# Patient Record
Sex: Male | Born: 1985 | Hispanic: Yes | Marital: Married | Smoking: Never smoker
Health system: Southern US, Community
[De-identification: ages and names within clinical notes are randomized; demographics above are authoritative.]

---

## 2007-06-08 ENCOUNTER — Emergency Department: Payer: Self-pay | Admitting: Emergency Medicine

## 2008-04-19 ENCOUNTER — Emergency Department: Payer: Self-pay | Admitting: Emergency Medicine

## 2010-07-04 ENCOUNTER — Emergency Department: Payer: Self-pay | Admitting: Emergency Medicine

## 2013-09-08 ENCOUNTER — Emergency Department: Payer: Self-pay | Admitting: Emergency Medicine

## 2018-04-07 ENCOUNTER — Emergency Department
Admission: EM | Admit: 2018-04-07 | Discharge: 2018-04-07 | Disposition: A | Discharge status: Home / Self Care | Payer: No Typology Code available for payment source | Attending: Emergency Medicine | Admitting: Emergency Medicine

## 2018-04-07 ENCOUNTER — Emergency Department: Payer: No Typology Code available for payment source

## 2018-04-07 ENCOUNTER — Other Ambulatory Visit: Payer: Self-pay

## 2018-04-07 ENCOUNTER — Encounter: Payer: Self-pay | Admitting: Emergency Medicine

## 2018-04-07 DIAGNOSIS — M7918 Myalgia, other site: Secondary | ICD-10-CM

## 2018-04-07 DIAGNOSIS — M791 Myalgia, unspecified site: Secondary | ICD-10-CM | POA: Diagnosis not present

## 2018-04-07 DIAGNOSIS — M542 Cervicalgia: Secondary | ICD-10-CM | POA: Diagnosis present

## 2018-04-07 MED ORDER — IBUPROFEN 600 MG PO TABS: 600.0000 mg | ORAL_TABLET | Freq: Three times a day (TID) | ORAL | 0 refills | Status: AC | PRN

## 2018-04-07 MED ORDER — CYCLOBENZAPRINE HCL 10 MG PO TABS
10.0000 mg | ORAL_TABLET | Freq: Once | ORAL | Status: AC
  Administered 2018-04-07: 10 mg via ORAL
  Filled 2018-04-07: qty 1

## 2018-04-07 MED ORDER — CYCLOBENZAPRINE HCL 10 MG PO TABS: 10.0000 mg | ORAL_TABLET | Freq: Three times a day (TID) | ORAL | 0 refills | Status: AC | PRN

## 2018-04-07 MED ORDER — TRAMADOL HCL 50 MG PO TABS: 50.0000 mg | ORAL_TABLET | Freq: Two times a day (BID) | ORAL | 0 refills | Status: AC | PRN

## 2018-04-07 MED ORDER — IBUPROFEN 600 MG PO TABS
600.0000 mg | ORAL_TABLET | Freq: Once | ORAL | Status: AC
  Administered 2018-04-07: 600 mg via ORAL
  Filled 2018-04-07: qty 1

## 2018-04-07 MED ORDER — TRAMADOL HCL 50 MG PO TABS
50.0000 mg | ORAL_TABLET | Freq: Once | ORAL | Status: AC
  Administered 2018-04-07: 50 mg via ORAL
  Filled 2018-04-07: qty 1
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
## 2018-05-20 DIAGNOSIS — R945 Abnormal results of liver function studies: Secondary | ICD-10-CM | POA: Insufficient documentation

## 2018-05-20 DIAGNOSIS — R109 Unspecified abdominal pain: Secondary | ICD-10-CM | POA: Insufficient documentation

## 2018-05-20 LAB — URINALYSIS, COMPLETE (UACMP) WITH MICROSCOPIC
BILIRUBIN URINE: NEGATIVE
Bacteria, UA: NONE SEEN
GLUCOSE, UA: NEGATIVE mg/dL
HGB URINE DIPSTICK: NEGATIVE
Ketones, ur: NEGATIVE mg/dL
Leukocytes, UA: NEGATIVE
NITRITE: NEGATIVE
PH: 6 (ref 5.0–8.0)
Protein, ur: NEGATIVE mg/dL
SPECIFIC GRAVITY, URINE: 1.017 (ref 1.005–1.030)

## 2018-05-20 LAB — CBC
HCT: 42.8 % (ref 40.0–52.0)
Hemoglobin: 14.6 g/dL (ref 13.0–18.0)
MCH: 29.9 pg (ref 26.0–34.0)
MCHC: 34.2 g/dL (ref 32.0–36.0)
MCV: 87.6 fL (ref 80.0–100.0)
PLATELETS: 211 10*3/uL (ref 150–440)
RBC: 4.88 MIL/uL (ref 4.40–5.90)
RDW: 12.3 % (ref 11.5–14.5)
WBC: 7.9 10*3/uL (ref 3.8–10.6)

## 2018-05-20 LAB — COMPREHENSIVE METABOLIC PANEL
ALBUMIN: 4.4 g/dL (ref 3.5–5.0)
ALK PHOS: 124 U/L (ref 38–126)
ALT: 67 U/L — ABNORMAL HIGH (ref 0–44)
AST: 48 U/L — AB (ref 15–41)
Anion gap: 6 (ref 5–15)
BILIRUBIN TOTAL: 0.8 mg/dL (ref 0.3–1.2)
BUN: 21 mg/dL — AB (ref 6–20)
CALCIUM: 9.3 mg/dL (ref 8.9–10.3)
CO2: 30 mmol/L (ref 22–32)
CREATININE: 0.83 mg/dL (ref 0.61–1.24)
Chloride: 105 mmol/L (ref 98–111)
GFR calc Af Amer: >60 mL/min (ref >60)
GFR calc non Af Amer: >60 mL/min (ref >60)
GLUCOSE: 119 mg/dL — AB (ref 70–99)
Potassium: 3.7 mmol/L (ref 3.5–5.1)
Sodium: 141 mmol/L (ref 135–145)
Total Protein: 7.8 g/dL (ref 6.5–8.1)

## 2018-05-20 LAB — LIPASE, BLOOD: Lipase: 31 U/L (ref 11–51)

## 2018-05-20 NOTE — ED Triage Notes (Signed)
Patient c/o medial/lower abdominal pain beginning today. Patient c/o pain post urination X 1 week. Patient reports malodorous urine.

## 2018-05-21 ENCOUNTER — Emergency Department
Admission: EM | Admit: 2018-05-21 | Discharge: 2018-05-21 | Disposition: A | Discharge status: Home / Self Care | Payer: Self-pay | Attending: Emergency Medicine | Admitting: Emergency Medicine

## 2018-05-21 ENCOUNTER — Emergency Department: Payer: Self-pay

## 2018-05-21 ENCOUNTER — Encounter: Payer: Self-pay | Admitting: Radiology

## 2018-05-21 DIAGNOSIS — R7989 Other specified abnormal findings of blood chemistry: Secondary | ICD-10-CM

## 2018-05-21 DIAGNOSIS — R109 Unspecified abdominal pain: Secondary | ICD-10-CM

## 2018-05-21 DIAGNOSIS — R945 Abnormal results of liver function studies: Secondary | ICD-10-CM

## 2018-05-21 MED ORDER — ONDANSETRON HCL 4 MG/2ML IJ SOLN
4.0000 mg | INTRAMUSCULAR | Status: AC
  Administered 2018-05-21: 4 mg via INTRAVENOUS
  Filled 2018-05-21: qty 2

## 2018-05-21 MED ORDER — IOPAMIDOL (ISOVUE-300) INJECTION 61%
100.0000 mL | Freq: Once | INTRAVENOUS | Status: AC | PRN
  Administered 2018-05-21: 100 mL via INTRAVENOUS

## 2018-05-21 MED ORDER — MORPHINE SULFATE (PF) 4 MG/ML IV SOLN
4.0000 mg | Freq: Once | INTRAVENOUS | Status: AC
  Administered 2018-05-21: 4 mg via INTRAVENOUS
  Filled 2018-05-21: qty 1

## 2018-05-21 MED ORDER — OXYCODONE-ACETAMINOPHEN 5-325 MG PO TABS
2.0000 | ORAL_TABLET | Freq: Once | ORAL | Status: AC
  Administered 2018-05-21: 2 via ORAL
  Filled 2018-05-21: qty 2

## 2018-05-21 NOTE — Discharge Instructions (Signed)

## 2018-05-21 NOTE — ED Provider Notes (Signed)
[facility] Emergency Department Provider Note  ____________________________________________   First MD Initiated Contact with Patient 05/21/18 [phone]     (approximate)  I have reviewed the triage vital signs and the nursing notes.   HISTORY  Chief Complaint Abdominal Pain  The patient and/or family speak(s) Spanish.  They understand they have the right to the use of a hospital interpreter, however at this time they prefer to speak directly with me in Spanish.  They know that they can ask for an interpreter at any time.   HPI Dylan Ponce is a 32 y.o. male with no chronic medical issues who presents for evaluation of persistent abdominal pain over the last 24 hours.  He states that occasionally he will have some pain in his lower abdomen after urinating but that urinating itself is not painful.  However the pain would come and go before but over the last 24 hours he says it has been persistent and it hurt all day while he was at work.  Nothing particular makes it better or worse.  He does not feel worse after eating and drinking which she has been able to do today.  He denies fever/chills, chest pain, shortness of breath, nausea, vomiting, diarrhea, and constipation.  He states he had 3 normal bowel movements today which is his usual routine.  He is not having any burning with urination.  He has no upper abdominal pain.  He describes the pain is severe, aching, and constant.  History reviewed. No pertinent past medical history.  There are no active problems to display for this patient.   History reviewed. No pertinent surgical history.  Prior to Admission medications   Medication Sig Start Date End Date Taking? Authorizing Provider  cyclobenzaprine (FLEXERIL) 10 MG tablet Take 1 tablet (10 mg total) by mouth 3 (three) times daily as needed. 04/07/18   Joni Reining, PA-C  ibuprofen (ADVIL,MOTRIN) 600 MG tablet Take 1 tablet (600 mg total) by mouth  every 8 (eight) hours as needed. 04/07/18   Joni Reining, PA-C  traMADol (ULTRAM) 50 MG tablet Take 1 tablet (50 mg total) by mouth every 12 (twelve) hours as needed. 04/07/18   Joni Reining, PA-C    Allergies Patient has no known allergies.  No family history on file.  Social History Social History   Tobacco Use  . Smoking status: Never Smoker  . Smokeless tobacco: Never Used  Substance Use Topics  . Alcohol use: Yes    Comment: occasional  . Drug use: Not on file    Review of Systems Constitutional: No fever/chills Eyes: No visual changes. ENT: No sore throat. Cardiovascular: Denies chest pain. Respiratory: Denies shortness of breath. Gastrointestinal: Abdominal pain as described above. Genitourinary: Negative for dysuria. Musculoskeletal: Negative for neck pain.  Negative for back pain. Integumentary: Negative for rash. Neurological: Negative for headaches, focal weakness or numbness.   ____________________________________________   PHYSICAL EXAM:  VITAL SIGNS: ED Triage Vitals  Enc Vitals Group     BP 05/20/18 2249 134/86     Pulse Rate 05/20/18 2249 64     Resp 05/20/18 2249 18     Temp 05/20/18 2249 98.2 F (36.8 C)     Temp Source 05/20/18 2249 Oral     SpO2 05/20/18 2249 94 %     Weight 05/20/18 2250 78.9 kg (174 lb)     Height 05/20/18 2250 1.702 m (5\' 7" )     Head Circumference --  Peak Flow --      Pain Score 05/20/18 2249 10     Pain Loc --      Pain Edu? --      Excl. in GC? --     Constitutional: Alert and oriented.  Generally well-appearing but does appear uncomfortable Eyes: Conjunctivae are normal.  Head: Atraumatic. Nose: No congestion/rhinnorhea. Mouth/Throat: Mucous membranes are moist. Neck: No stridor.  No meningeal signs.   Cardiovascular: Normal rate, regular rhythm. Good peripheral circulation. Grossly normal heart sounds. Respiratory: Normal respiratory effort.  No retractions. Lungs CTAB. Gastrointestinal: Soft with  generalized tenderness to palpation throughout the entire abdomen.  There is no focal tenderness of the epigastrium, no right upper quadrant tenderness, and no Murphy sign.  No rebound and no guarding. Musculoskeletal: No lower extremity tenderness nor edema. No gross deformities of extremities. Neurologic:  Normal speech and language. No gross focal neurologic deficits are appreciated.  Skin:  Skin is warm, dry and intact. No rash noted. Psychiatric: Mood and affect are normal. Speech and behavior are normal.  ____________________________________________   LABS (all labs ordered are listed, but only abnormal results are displayed)  Labs Reviewed  COMPREHENSIVE METABOLIC PANEL - Abnormal; Notable for the following components:      Result Value   Glucose, Bld 119 (*)    BUN 21 (*)    AST 48 (*)    ALT 67 (*)    All other components within normal limits  URINALYSIS, COMPLETE (UACMP) WITH MICROSCOPIC - Abnormal; Notable for the following components:   Color, Urine YELLOW (*)    APPearance CLEAR (*)    All other components within normal limits  LIPASE, BLOOD  CBC   ____________________________________________  EKG  None - EKG not ordered by ED physician ____________________________________________  RADIOLOGY   ED MD interpretation: No indication of any acute abnormalities on the CT scan of the abdomen and pelvis  Official radiology report(s): Ct Abdomen Pelvis W Contrast  Result Date: 05/21/2018 CLINICAL DATA:  Acute abdominal pain.  Nausea and vomiting. EXAM: CT ABDOMEN AND PELVIS WITH CONTRAST TECHNIQUE: Multidetector CT imaging of the abdomen and pelvis was performed using the standard protocol following bolus administration of intravenous contrast. CONTRAST:  ISOVUE-300 IOPAMIDOL (ISOVUE-300) INJECTION 61% COMPARISON:  None. FINDINGS: Lower chest: Scattered lower lobe, lingular and right middle lobe atelectasis. No pleural fluid or confluent consolidation.  Hepatobiliary: Focal fatty infiltration adjacent with falciform ligament. No discrete focal lesion. Gallbladder physiologically distended, no calcified stone. No biliary dilatation. Pancreas: No ductal dilatation or inflammation. Spleen: Normal in size without focal abnormality. Adrenals/Urinary Tract: Normal adrenal glands. No hydronephrosis or perinephric edema. Homogeneous renal enhancement. Urinary bladder is minimally distended, and not well evaluated. Stomach/Bowel: Stomach is within normal limits. Appendix appears normal. No evidence of bowel wall thickening, distention, or inflammatory changes. Vascular/Lymphatic: No significant vascular findings are present. No enlarged abdominal or pelvic lymph nodes. Reproductive: Prostate is unremarkable. Other: No free air, free fluid, or intra-abdominal fluid collection. Musculoskeletal: There are no acute or suspicious osseous abnormalities. IMPRESSION: No acute findings in the abdomen/pelvis or explanation for abdominal pain. Electronically Signed   By: Narda Rutherford M.D.   On: 05/21/2018 04:34    ____________________________________________   PROCEDURES  Critical Care performed: No   Procedure(s) performed:   Procedures   ____________________________________________   INITIAL IMPRESSION / ASSESSMENT AND PLAN / ED COURSE  As part of my medical decision making, I reviewed the following data within the electronic MEDICAL RECORD NUMBER History obtained  from family, Nursing notes reviewed and incorporated, Labs reviewed  and Notes from prior ED visits    Differential diagnosis includes, but is not limited to, appendicitis, diverticulitis, acid reflux, musculoskeletal strain, less likely biliary colic based on his physical exam.  Lab work is all within normal limits with no LFT elevation nor lipase elevation.  He says he does not drink alcohol.  Given the pain is describing I will obtain a CT scan of the abdomen and pelvis for evaluation of possible  acute infectious process.  when doing taken a light bulb there is I am giving morphine 4 mill grams IV and Zofran 4 mg IV for symptomatic relief.   (Note that documentation was delayed due to multiple ED patients requiring immediate care.)   CT scan was unremarkable with no abnormalities identified.  The patient is more comfortable and he also received 2 Percocet by mouth.  I explained the results to him and his wife and they are comfortable with plan for discharge and outpatient follow-up.   I gave my usual and customary return precautions.        ____________________________________________  FINAL CLINICAL IMPRESSION(S) / ED DIAGNOSES  Final diagnoses:  Abdominal pain, unspecified abdominal location  Elevated LFTs     MEDICATIONS GIVEN DURING THIS VISIT:  Medications  morphine 4 MG/ML injection 4 mg (4 mg Intravenous Given 05/21/18 0400)  ondansetron (ZOFRAN) injection 4 mg (4 mg Intravenous Given 05/21/18 0400)  iopamidol (ISOVUE-300) 61 % injection 100 mL (100 mLs Intravenous Contrast Given 05/21/18 0410)  oxyCODONE-acetaminophen (PERCOCET/ROXICET) 5-325 MG per tablet 2 tablet (2 tablets Oral Given 05/21/18 1610)     ED Discharge Orders    None       Note:  This document was prepared using Dragon voice recognition software and may include unintentional dictation errors.    Loleta Rose, MD 05/21/18 [phone]

## 2019-03-29 ENCOUNTER — Other Ambulatory Visit: Payer: Self-pay

## 2019-03-29 ENCOUNTER — Emergency Department
Admission: EM | Admit: 2019-03-29 | Discharge: 2019-03-29 | Disposition: A | Discharge status: Home / Self Care | Payer: Self-pay | Attending: Emergency Medicine | Admitting: Emergency Medicine

## 2019-03-29 ENCOUNTER — Encounter: Payer: Self-pay | Admitting: Emergency Medicine

## 2019-03-29 DIAGNOSIS — Z5321 Procedure and treatment not carried out due to patient leaving prior to being seen by health care provider: Secondary | ICD-10-CM | POA: Insufficient documentation

## 2019-03-29 DIAGNOSIS — R51 Headache: Secondary | ICD-10-CM | POA: Insufficient documentation

## 2019-03-29 NOTE — ED Triage Notes (Signed)
Patient ambulatory to triage with steady gait, without difficulty or distress noted, mask in place; pt reports frontal and temporal HA x 3wks accomp by nausea; st rx med last wk by doctor without relief but unsure name; st hx migraines several years ago

## 2019-03-29 NOTE — ED Notes (Signed)
Pt visitor to STAT desk and report pt is wanting to go home. Headache is improving. Pt educated regarding risks of leaving prior to MD evaluation. Verbalized understanding. Pt to return if symptoms worsen.

## 2019-08-23 IMAGING — CT CT ABD-PELV W/ CM
2 of 4 series · 16 of 46 positions shown, 18 images · IV contrast (APPLIED)
Comparison: None.

CLINICAL DATA: Acute abdominal pain.  Nausea and vomiting.

EXAM:
CT ABDOMEN AND PELVIS WITH CONTRAST
TECHNIQUE: Multidetector CT imaging of the abdomen and pelvis was performed
using the standard protocol following bolus administration of
intravenous contrast.
CONTRAST:  100mL [id number] IOPAMIDOL ([id number]) INJECTION 61%

[Series 2: routine abd/pel with · axial · 0.70mm/px · z∈[-890,-430]mm · 13 of 102 slices shown, 15 images]
[im 5/102  soft-tissue]
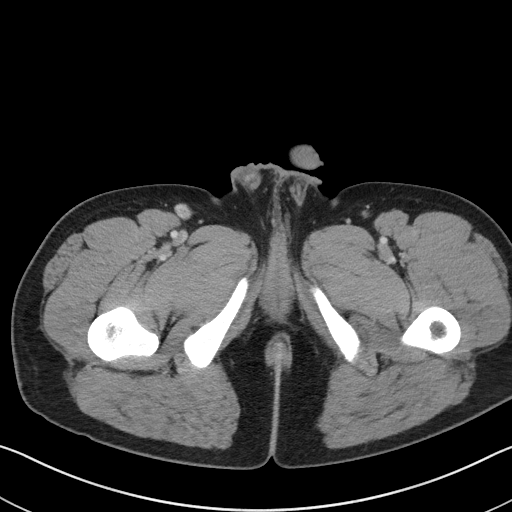
[im 5/102  bone]
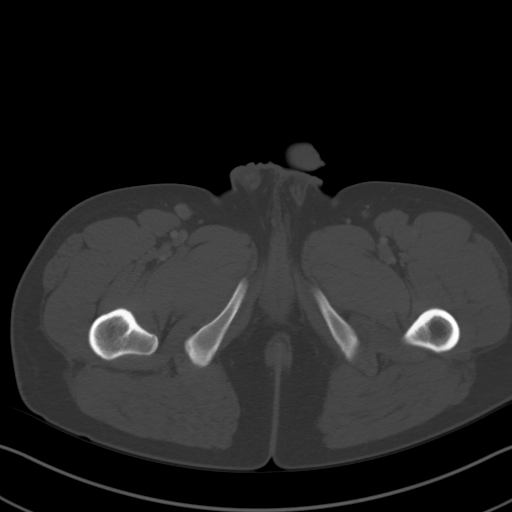
[im 13/102  soft-tissue]
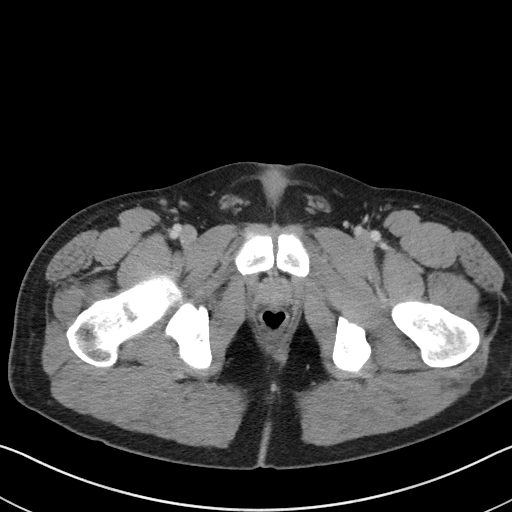
[im 21/102  soft-tissue]
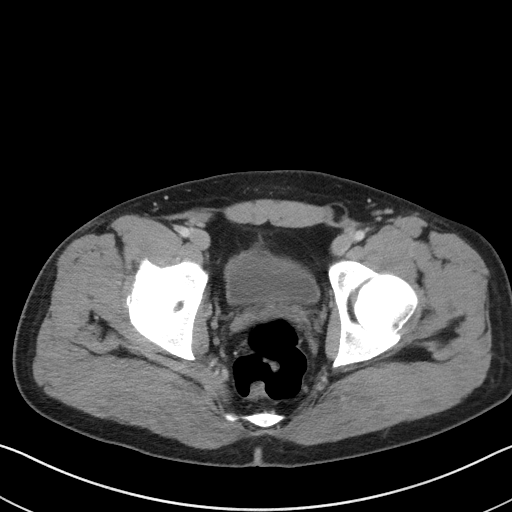
[im 29/102  soft-tissue]
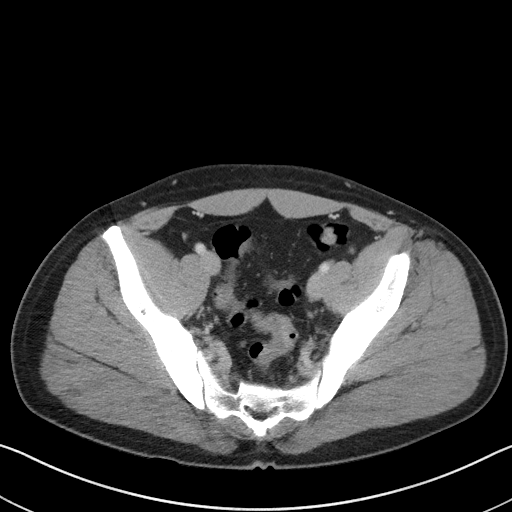
[im 37/102  soft-tissue]
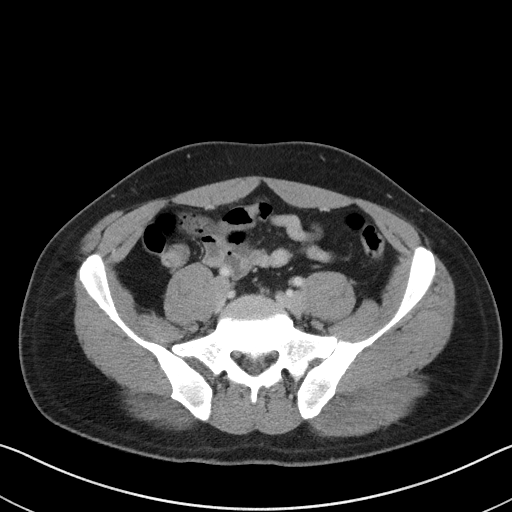
[im 45/102  soft-tissue]
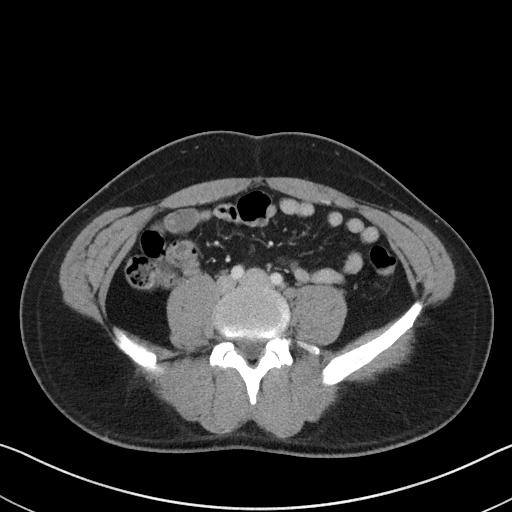
[im 53/102  soft-tissue]
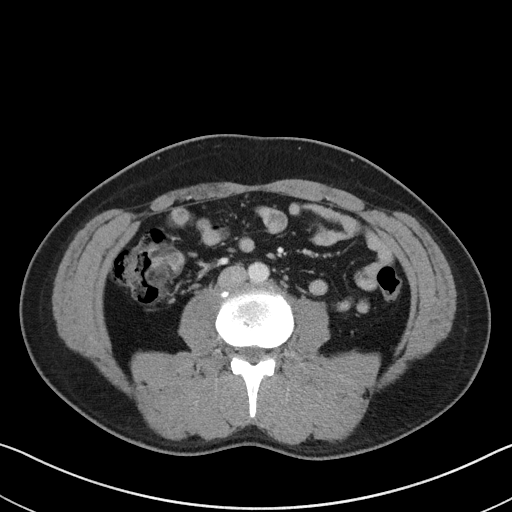
[im 57/102  soft-tissue]
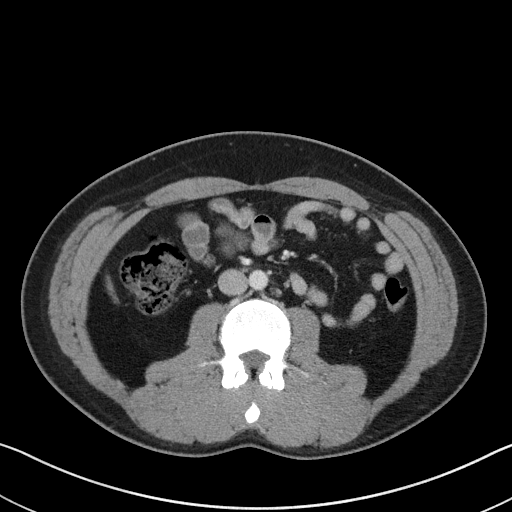
[im 65/102  soft-tissue]
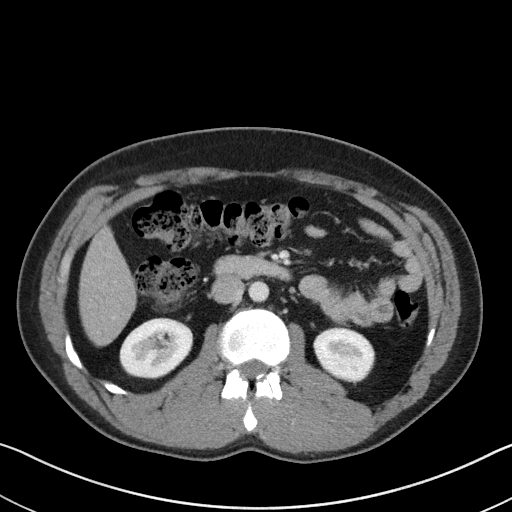
[im 65/102  bone]
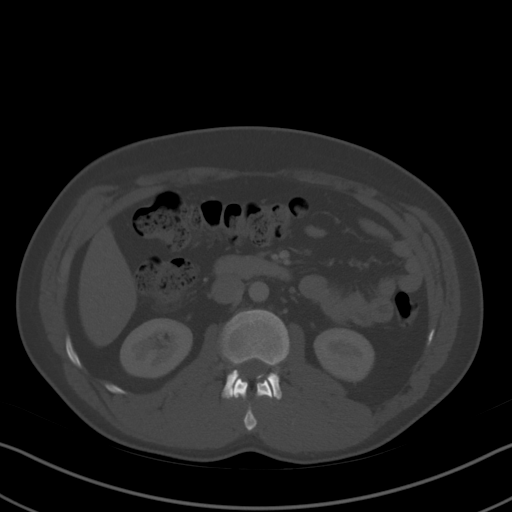
[im 73/102  soft-tissue]
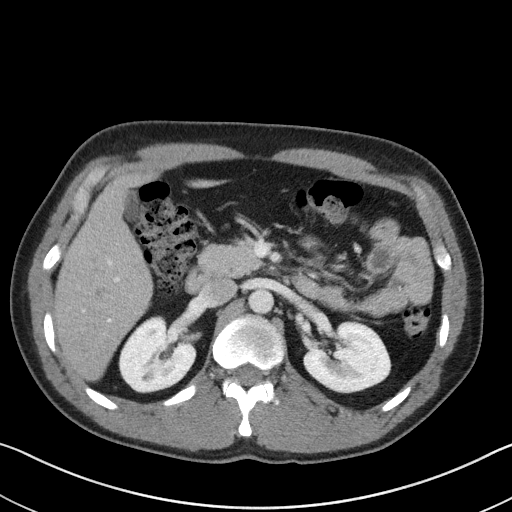
[im 81/102  soft-tissue]
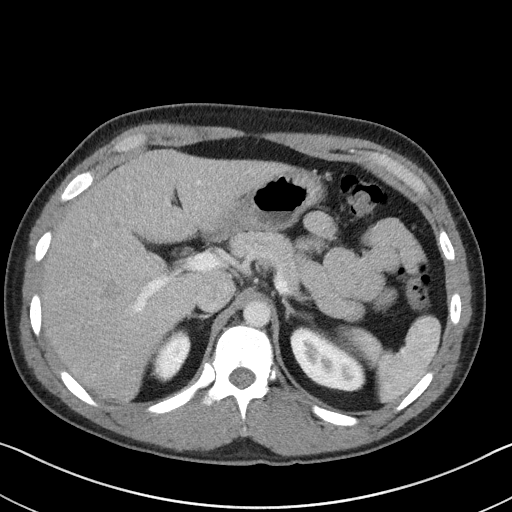
[im 89/102  soft-tissue]
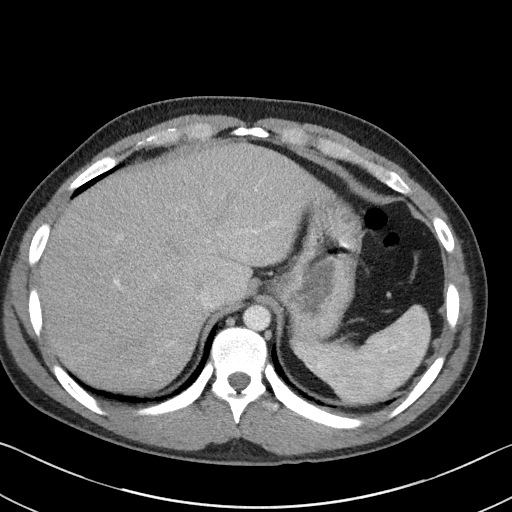
[im 97/102  soft-tissue]
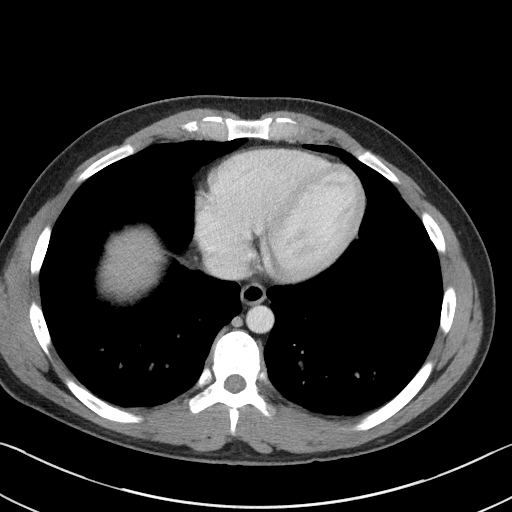

[Series 5: coronal st · coronal · 0.72mm/px · 3 of 86 slices shown]
[im 29/86  soft-tissue]
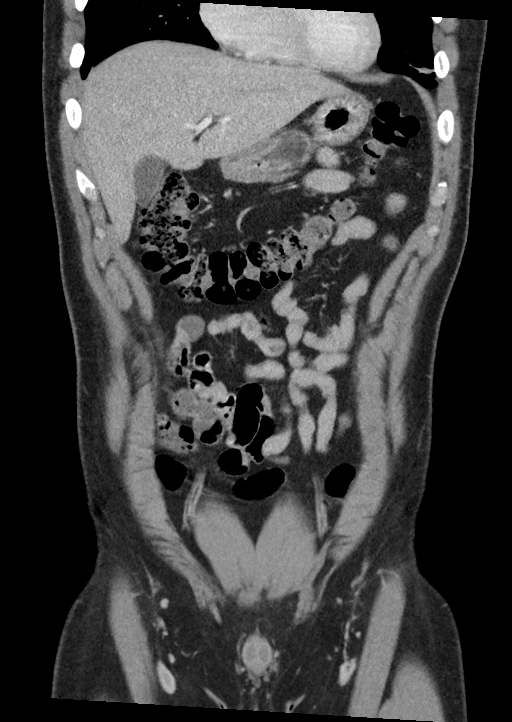
[im 38/86  soft-tissue]
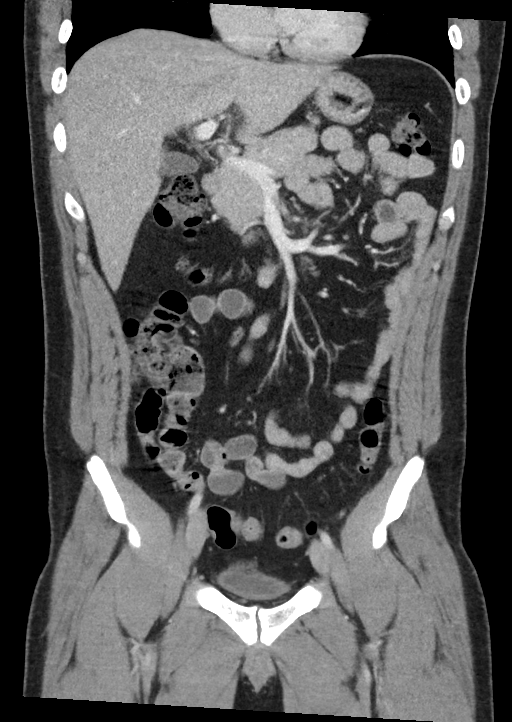
[im 48/86  soft-tissue]
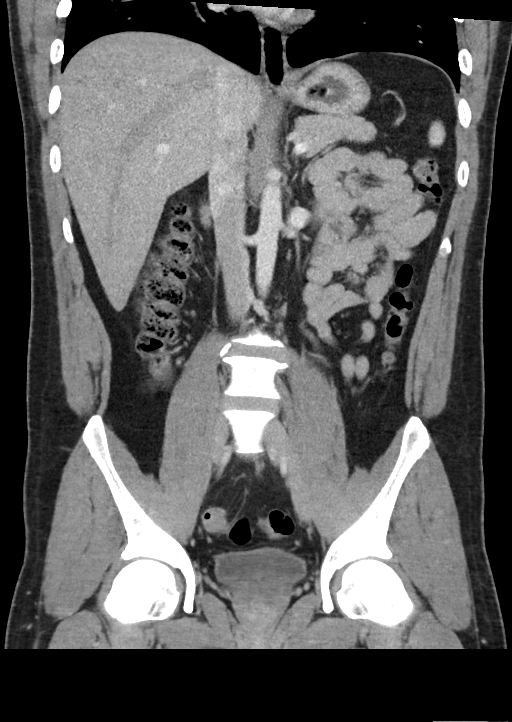

[16 of 46 positions shown; findings below may reference images not displayed]

FINDINGS: Lower chest: Scattered lower lobe, lingular and right middle lobe
atelectasis. No pleural fluid or confluent consolidation.

Hepatobiliary: Focal fatty infiltration adjacent with falciform
ligament. No discrete focal lesion. Gallbladder physiologically
distended, no calcified stone. No biliary dilatation.

Pancreas: No ductal dilatation or inflammation.

Spleen: Normal in size without focal abnormality.

Adrenals/Urinary Tract: Normal adrenal glands. No hydronephrosis or
perinephric edema. Homogeneous renal enhancement. Urinary bladder is
minimally distended, and not well evaluated.

Stomach/Bowel: Stomach is within normal limits. Appendix appears
normal. No evidence of bowel wall thickening, distention, or
inflammatory changes.

Vascular/Lymphatic: No significant vascular findings are present. No
enlarged abdominal or pelvic lymph nodes.

Reproductive: Prostate is unremarkable.

Other: No free air, free fluid, or intra-abdominal fluid collection.

Musculoskeletal: There are no acute or suspicious osseous
abnormalities.
IMPRESSION: No acute findings in the abdomen/pelvis or explanation for abdominal
pain.
